# Patient Record
Sex: Male | Born: 1985
Health system: Southern US, Community
[De-identification: ages and names within clinical notes are randomized; demographics above are authoritative.]

## PROBLEM LIST (undated history)

## (undated) HISTORY — PX: ANKLE SURGERY: SHX546

---
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
## 2012-06-17 ENCOUNTER — Emergency Department (INDEPENDENT_AMBULATORY_CARE_PROVIDER_SITE_OTHER): Payer: 59

## 2012-06-17 ENCOUNTER — Emergency Department (HOSPITAL_COMMUNITY)
Admission: EM | Admit: 2012-06-17 | Discharge: 2012-06-17 | Disposition: A | Discharge status: Home / Self Care | Payer: 59 | Source: Home / Self Care | Attending: Family Medicine | Admitting: Family Medicine

## 2012-06-17 ENCOUNTER — Encounter (HOSPITAL_COMMUNITY): Payer: Self-pay | Admitting: *Deleted

## 2012-06-17 DIAGNOSIS — M546 Pain in thoracic spine: Secondary | ICD-10-CM

## 2012-06-17 DIAGNOSIS — Z041 Encounter for examination and observation following transport accident: Secondary | ICD-10-CM

## 2012-06-17 MED ORDER — CYCLOBENZAPRINE HCL 5 MG PO TABS: 5.0000 mg | ORAL_TABLET | Freq: Three times a day (TID) | ORAL | Status: AC | PRN

## 2012-06-17 MED ORDER — DICLOFENAC POTASSIUM 50 MG PO TABS: 50.0000 mg | ORAL_TABLET | Freq: Three times a day (TID) | ORAL | Status: DC
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
## 2013-05-22 ENCOUNTER — Encounter (HOSPITAL_COMMUNITY): Payer: Self-pay | Admitting: Emergency Medicine

## 2013-05-22 ENCOUNTER — Emergency Department (HOSPITAL_COMMUNITY): Payer: Self-pay

## 2013-05-22 ENCOUNTER — Emergency Department (HOSPITAL_COMMUNITY)
Admission: EM | Admit: 2013-05-22 | Discharge: 2013-05-22 | Disposition: A | Discharge status: Home / Self Care | Payer: Self-pay | Attending: Emergency Medicine | Admitting: Emergency Medicine

## 2013-05-22 DIAGNOSIS — F17200 Nicotine dependence, unspecified, uncomplicated: Secondary | ICD-10-CM | POA: Insufficient documentation

## 2013-05-22 DIAGNOSIS — S6000XA Contusion of unspecified finger without damage to nail, initial encounter: Secondary | ICD-10-CM | POA: Insufficient documentation

## 2013-05-22 DIAGNOSIS — IMO0002 Reserved for concepts with insufficient information to code with codable children: Secondary | ICD-10-CM | POA: Insufficient documentation

## 2013-05-22 DIAGNOSIS — Y9389 Activity, other specified: Secondary | ICD-10-CM | POA: Insufficient documentation

## 2013-05-22 DIAGNOSIS — Y9289 Other specified places as the place of occurrence of the external cause: Secondary | ICD-10-CM | POA: Insufficient documentation
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
## 2013-05-22 NOTE — ED Provider Notes (Signed)
CSN: [id number]     Arrival date & time 05/22/13  1639 History    This chart was scribed for a non-physician practitioner, Antony Madura, PA-C, working with Enid Skeens, MD by Frederik Pear, ED Scribe. This patient was seen in room TR05C/TR05C and the patient's care was started at 1653.     First MD Initiated Contact with Patient 05/22/13 1653     Chief Complaint  Patient presents with  . Finger Injury   (Consider location/radiation/quality/duration/timing/severity/associated sxs/prior Treatment) Patient is a 27 y.o. male presenting with hand injury. The history is provided by the patient and medical records. No language interpreter was used.  Hand Injury Location:  Finger Time since incident:  1 week Injury: yes   Mechanism of injury comment:  Smashed in a car door Finger location:  R index finger Pain details:    Radiates to:  Does not radiate   Severity:  Severe   Onset quality:  Sudden   Duration:  1 week   Timing:  Constant Chronicity:  New HPI Comments: Stephen Wells is a 27 y.o. male who presents to the Emergency Department complaining of a right index finger smash injury that occurred 1 week ago when he shut his finger in a car door. In the ED, he complains of severe, throbbing pain to the fingernail with pressure that is aggravated with movement and alleviated holding it still. He denies pain to his finger or hand. He states he came to the ED today because he has not seen improvement from the injury over the past week. He has treated the pain with Aleve with some relief. No allergies to medications.   History reviewed. No pertinent past medical history. History reviewed. No pertinent past surgical history. History reviewed. No pertinent family history. History  Substance Use Topics  . Smoking status: Current Some Day Smoker  . Smokeless tobacco: Not on file  . Alcohol Use: Yes    Review of Systems  Skin: Positive for wound (right index finger).  All other  systems reviewed and are negative.   Allergies  Review of patient's allergies indicates no known allergies.  Home Medications   Current Outpatient Rx  Name  Route  Sig  Dispense  Refill  . naproxen sodium (ANAPROX) 220 MG tablet   Oral   Take 220 mg by mouth 2 (two) times daily with a meal.          BP 122/75  Pulse 67  Temp(Src) 99 F (37.2 C) (Oral)  SpO2 99%  Physical Exam  Nursing note and vitals reviewed. Constitutional: He is oriented to person, place, and time. He appears well-developed and well-nourished. No distress.  HENT:  Head: Normocephalic and atraumatic.  Eyes: Conjunctivae and EOM are normal. No scleral icterus.  Neck: Normal range of motion.  Cardiovascular: Normal rate, regular rhythm and intact distal pulses.   Capillary refill is normal.  Pulmonary/Chest: Effort normal. No respiratory distress.  Abdominal: Soft. He exhibits no distension.  Musculoskeletal: Normal range of motion. He exhibits no edema and no tenderness.  Nail is intact, but appears to be out of the nail bed. Bruising around the nail bed. No heat-to-touch or erythema.  Neurological: He is alert and oriented to person, place, and time.  4/5 strength of extensor of the right index finger distal to his DIP joint. All other extensors of the right index finger are 5/5. FDP and FDS are 5/5. Sensation to light touch intact.  Skin: Skin is warm and dry.  No rash noted. No erythema. No pallor.  Psychiatric: He has a normal mood and affect. His behavior is normal.    ED Course   Procedures (including critical care time)  DIAGNOSTIC STUDIES: Oxygen Saturation is 99% on room air, normal by my interpretation.    COORDINATION OF CARE:  17:15- Discussed planned course of treatment in the ED with the patient, including a right index finger X-ray, who is agreeable at this time.  19:28- 1-view right index finger X-ray independently read by radiologist and independently reviewed by Antony Madura,  PA-C. Discussed the discharge plan with the patient, including following up with a hand specialist and applying an ace wrap for compression, who is agreeable at this time.   Labs Reviewed - No data to display Dg Finger Index Right  05/22/2013   *RADIOLOGY REPORT*  Clinical Data: Swelling, bruising, injury  RIGHT INDEX FINGER 2+V  Comparison: None  Findings: Three views of the left second finger submitted.  No acute fracture or subluxation.  Dorsal soft tissue swelling is noted distal finger.  IMPRESSION: No acute fracture or subluxation.  Dorsal soft tissue swelling distal finger.   Original Report Authenticated By: Natasha Mead, M.D.   1. Contusion of finger of right hand, initial encounter    MDM  Uncomplicated contusion to right index finger - physical exam findings as above. Patient neurovascularly intact. There is no erythema or heat-to-touch to suspect underlying infectious or cellulitic process. No pallor, pulselessness, poikilothermia, or paresthesias to suspect complicating injury. Extensor strength against resistance of distal R index finger is decreased; however this may be secondary to pain rather than tendon injury. Have recommended patient follow up with hand specialist for further evaluation; referral given. Patient appropriate for discharge with necessary followup. Ibuprofen recommended for pain control. Indications for ED return discussed and patient agreeable to plan.  I personally performed the services described in this documentation, which was scribed in my presence. The recorded information has been reviewed and is accurate.     Antony Madura, PA-C 05/22/13 1740  Antony Madura, PA-C 05/22/13 [phone]

## 2013-05-23 NOTE — ED Provider Notes (Signed)
Medical screening examination/treatment/procedure(s) were performed by non-physician practitioner and as supervising physician I was immediately available for consultation/collaboration.   Catera Hankins M Robby Pirani, MD 05/23/13 2103 

## 2016-04-12 ENCOUNTER — Other Ambulatory Visit: Payer: Self-pay | Admitting: Nurse Practitioner

## 2016-04-12 ENCOUNTER — Ambulatory Visit
Admission: RE | Admit: 2016-04-12 | Discharge: 2016-04-12 | Disposition: A | Discharge status: Home / Self Care | Payer: Worker's Compensation | Source: Ambulatory Visit | Attending: Nurse Practitioner | Admitting: Nurse Practitioner

## 2016-04-12 DIAGNOSIS — R609 Edema, unspecified: Secondary | ICD-10-CM

## 2016-04-12 DIAGNOSIS — R52 Pain, unspecified: Secondary | ICD-10-CM

## 2016-06-27 ENCOUNTER — Encounter (HOSPITAL_COMMUNITY): Payer: Self-pay | Admitting: Emergency Medicine

## 2016-06-27 ENCOUNTER — Ambulatory Visit (INDEPENDENT_AMBULATORY_CARE_PROVIDER_SITE_OTHER): Payer: Commercial Managed Care - HMO

## 2016-06-27 ENCOUNTER — Ambulatory Visit (HOSPITAL_COMMUNITY)
Admission: EM | Admit: 2016-06-27 | Discharge: 2016-06-27 | Disposition: A | Discharge status: Home / Self Care | Payer: Commercial Managed Care - HMO | Attending: Family Medicine | Admitting: Family Medicine

## 2016-06-27 DIAGNOSIS — S62309A Unspecified fracture of unspecified metacarpal bone, initial encounter for closed fracture: Secondary | ICD-10-CM

## 2016-06-27 NOTE — Progress Notes (Signed)
Orthopedic Tech Progress Note Patient Details:  Stephen Wells 1986/01/12 [id number]  Ortho Devices Type of Ortho Device: Ace wrap, Ulna gutter splint Ortho Device/Splint Location: RUE Ortho Device/Splint Interventions: Ordered, Application   Jennye MoccasinHughes, Stephen Wells 06/27/2016, 3:11 PM

## 2016-06-27 NOTE — ED Notes (Signed)
Contacted ortho tech, aware of patient's casting needs

## 2016-06-27 NOTE — Discharge Instructions (Signed)
You need to make a follow-up appointment with Dr. Dominica SeverinWilliam Gramig for the fracture. Please call his office and schedule an appointment for next week.  Study Result   CLINICAL DATA:  Patient with pain and swelling throughout the right hand after punching a wall 1 week prior. Initial encounter.   EXAM: RIGHT HAND - COMPLETE 3+ VIEW   COMPARISON:  None.   FINDINGS: There is a nondisplaced oblique lucency through the proximal aspect of the fifth metacarpal. Overlying soft tissue swelling. No evidence for associated acute osseous abnormalities. No significant degenerative changes.   IMPRESSION: Suggestion of nondisplaced oblique lucency through the proximal diaphysis of the fifth metacarpal with overlying soft tissue swelling, concerning for nondisplaced fracture. Vascular channel is additional consideration. Recommend correlation for point tenderness.
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
## 2016-06-27 NOTE — ED Triage Notes (Signed)
Right wrist pain for 7 days

## 2016-10-31 DIAGNOSIS — Z209 Contact with and (suspected) exposure to unspecified communicable disease: Secondary | ICD-10-CM | POA: Diagnosis not present

## 2017-04-19 DIAGNOSIS — R300 Dysuria: Secondary | ICD-10-CM | POA: Diagnosis not present

## 2017-07-11 IMAGING — CR DG FOOT COMPLETE 3+V*R*
3 series · 3 of 3 positions shown · non-contrast
Comparison: None.

CLINICAL DATA: Injury in Achilles tendon region 2 hours ago.
Difficulty walking.

EXAM:
RIGHT FOOT COMPLETE - 3+ VIEW

[x foot lat right]
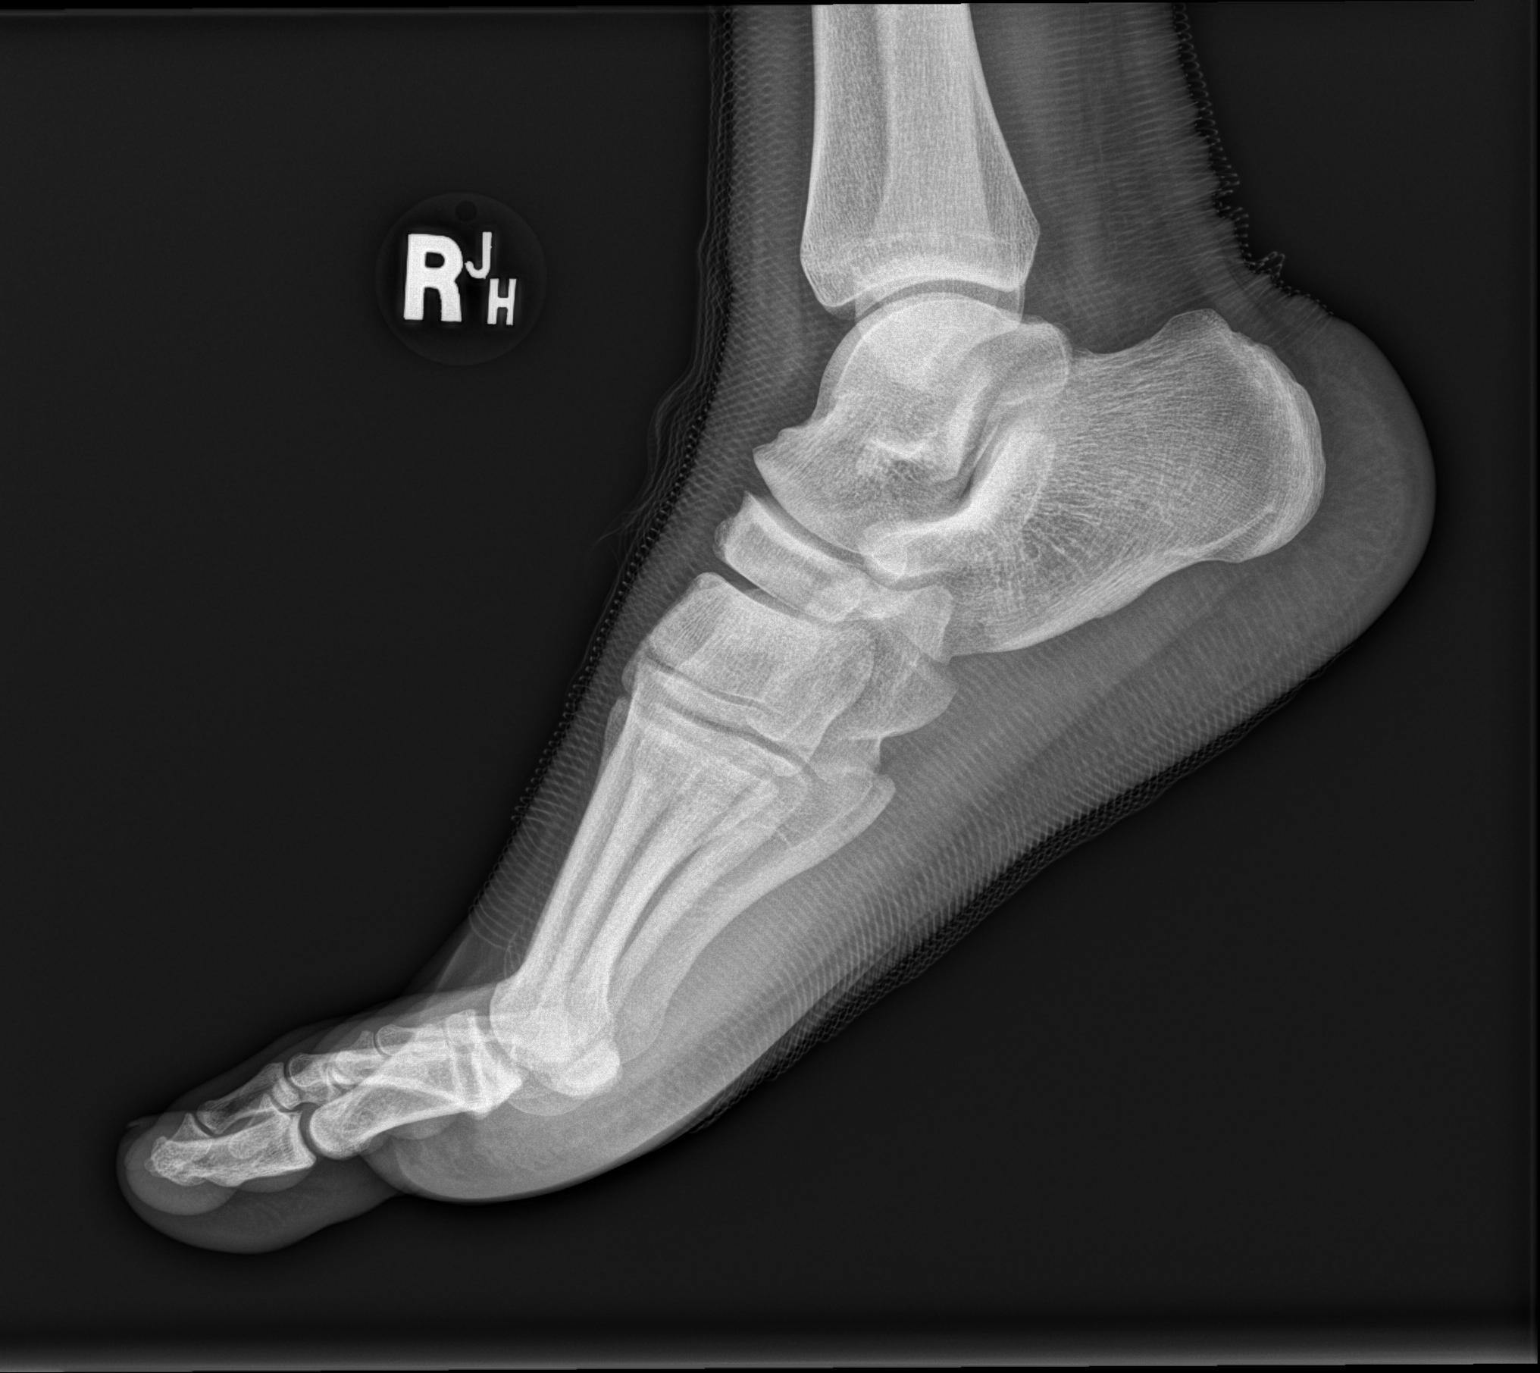

[x foot ap right]
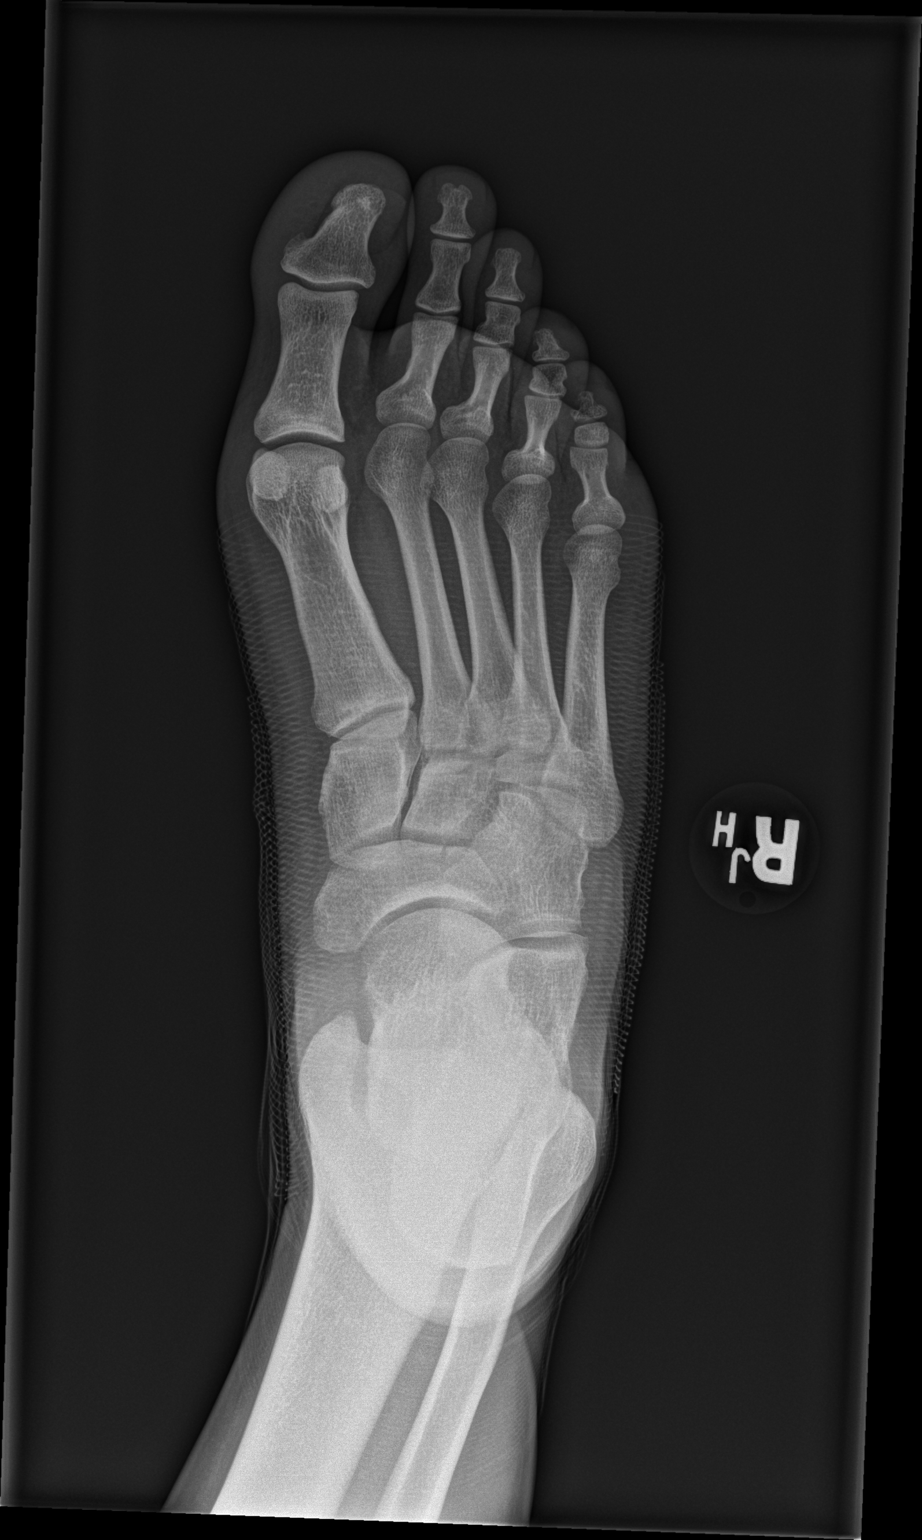

[x foot obl right]
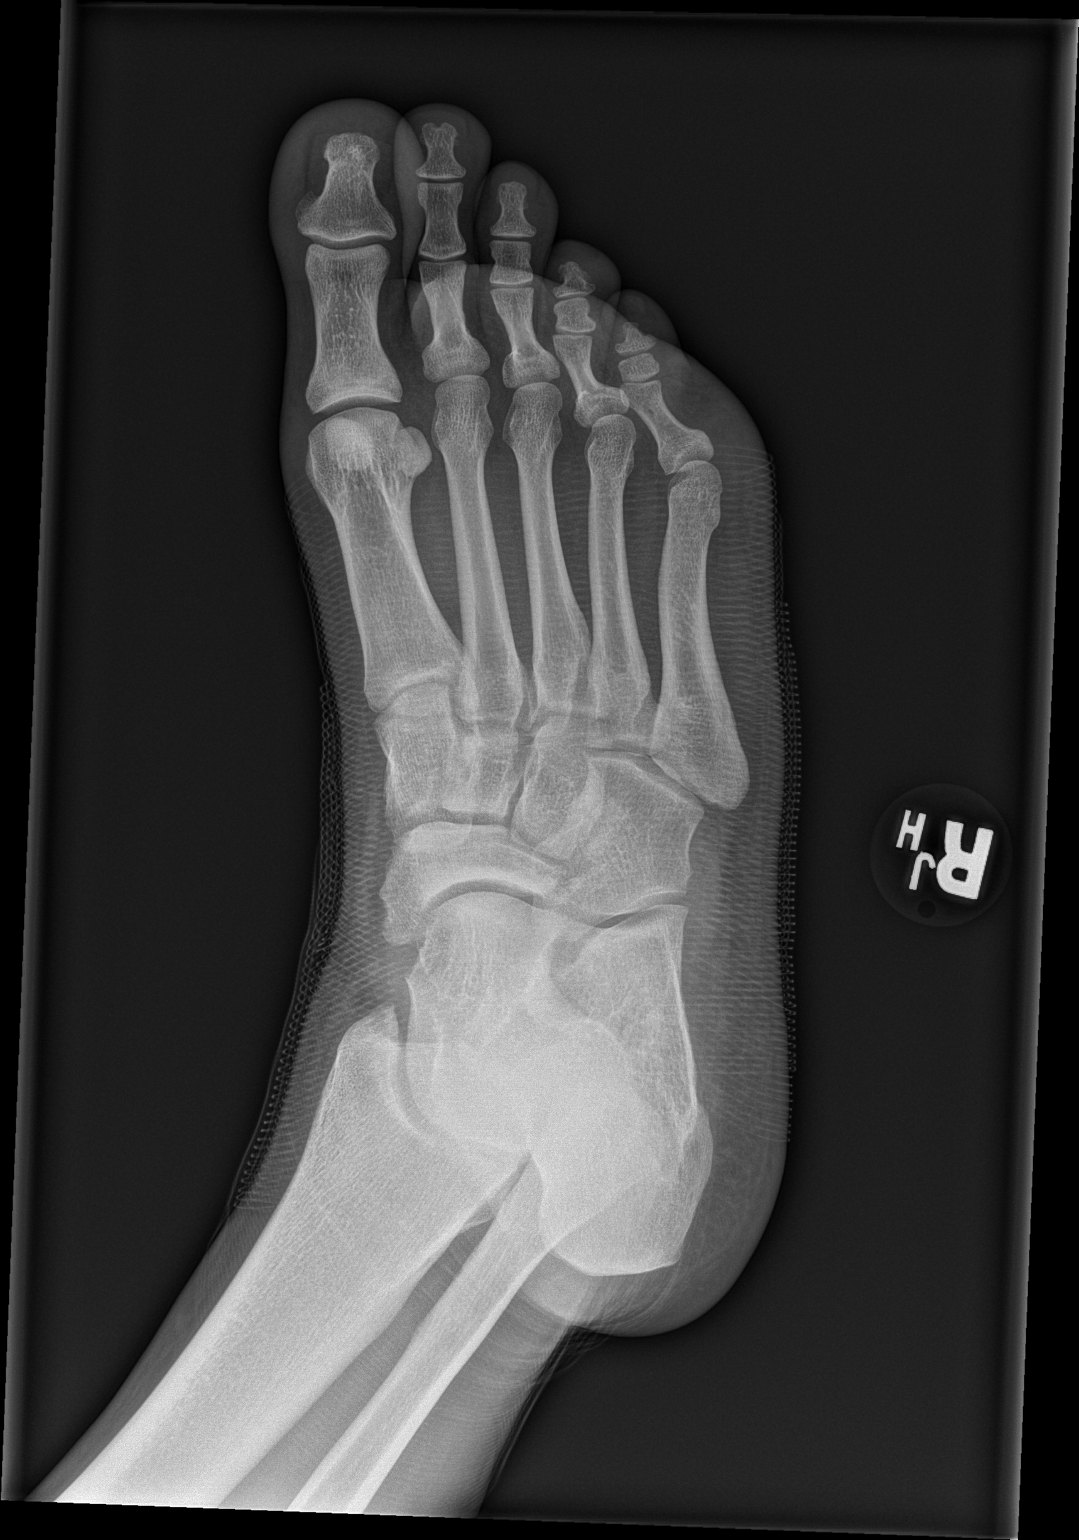

[3 of 3 positions shown; findings below may reference images not displayed]

FINDINGS: There is no evidence of fracture or dislocation. There is no
evidence of arthropathy or other focal bone abnormality. Soft
tissues are unremarkable.
IMPRESSION: Negative.

## 2017-09-05 ENCOUNTER — Encounter (HOSPITAL_COMMUNITY): Payer: Self-pay | Admitting: Emergency Medicine

## 2017-09-05 ENCOUNTER — Ambulatory Visit (HOSPITAL_COMMUNITY)
Admission: EM | Admit: 2017-09-05 | Discharge: 2017-09-05 | Disposition: A | Discharge status: Home / Self Care | Payer: 59 | Attending: Family Medicine | Admitting: Family Medicine

## 2017-09-05 ENCOUNTER — Other Ambulatory Visit: Payer: Self-pay

## 2017-09-05 DIAGNOSIS — Z202 Contact with and (suspected) exposure to infections with a predominantly sexual mode of transmission: Secondary | ICD-10-CM

## 2017-09-05 DIAGNOSIS — Z113 Encounter for screening for infections with a predominantly sexual mode of transmission: Secondary | ICD-10-CM

## 2017-09-05 LAB — POCT URINALYSIS DIP (DEVICE)
BILIRUBIN URINE: NEGATIVE
GLUCOSE, UA: NEGATIVE mg/dL
Hgb urine dipstick: NEGATIVE
KETONES UR: NEGATIVE mg/dL
Leukocytes, UA: NEGATIVE
Nitrite: NEGATIVE
PROTEIN: NEGATIVE mg/dL
Specific Gravity, Urine: >=1.03 (ref 1.005–1.030)
Urobilinogen, UA: 0.2 mg/dL (ref 0.0–1.0)
pH: 6 (ref 5.0–8.0)

## 2017-09-05 MED ORDER — CEFTRIAXONE SODIUM 250 MG IJ SOLR
INTRAMUSCULAR | Status: AC
  Filled 2017-09-05: qty 250

## 2017-09-05 MED ORDER — METRONIDAZOLE 500 MG PO TABS
2000.0000 mg | ORAL_TABLET | Freq: Once | ORAL | Status: AC
  Administered 2017-09-05: 2000 mg via ORAL

## 2017-09-05 MED ORDER — METRONIDAZOLE 500 MG PO TABS
ORAL_TABLET | ORAL | Status: AC
  Filled 2017-09-05: qty 4

## 2017-09-05 MED ORDER — AZITHROMYCIN 250 MG PO TABS
1000.0000 mg | ORAL_TABLET | Freq: Once | ORAL | Status: AC
  Administered 2017-09-05: 1000 mg via ORAL

## 2017-09-05 MED ORDER — AZITHROMYCIN 250 MG PO TABS
ORAL_TABLET | ORAL | Status: AC
  Filled 2017-09-05: qty 4

## 2017-09-05 MED ORDER — CEFTRIAXONE SODIUM 250 MG IJ SOLR
250.0000 mg | Freq: Once | INTRAMUSCULAR | Status: AC
  Administered 2017-09-05: 250 mg via INTRAMUSCULAR

## 2017-09-05 NOTE — Discharge Instructions (Signed)
Practice safe sex

## 2017-09-05 NOTE — ED Triage Notes (Signed)
Pt states "I got a call from a lady that I should get checked". Pt partner stated she had some vaginal discharge. Pt denies symptoms.

## 2017-09-05 NOTE — ED Provider Notes (Signed)
  [facility]   [id number] 09/05/17 Arrival Time: 1554   SUBJECTIVE:  Marina Gravellfonzo Derderian III is a 31 y.o. male who presents to the urgent care with complaint of exposure to STD.     History reviewed. No pertinent past medical history. No family history on file. Social History   Socioeconomic History  . Marital status: Single    Spouse name: Not on file  . Number of children: Not on file  . Years of education: Not on file  . Highest education level: Not on file  Social Needs  . Financial resource strain: Not on file  . Food insecurity - worry: Not on file  . Food insecurity - inability: Not on file  . Transportation needs - medical: Not on file  . Transportation needs - non-medical: Not on file  Occupational History  . Not on file  Tobacco Use  . Smoking status: Current Some Day Smoker  Substance and Sexual Activity  . Alcohol use: Yes  . Drug use: No  . Sexual activity: Not on file  Other Topics Concern  . Not on file  Social History Narrative  . Not on file   No outpatient medications have been marked as taking for the 09/05/17 encounter [facility](Hospital Encounter).   No Known Allergies    ROS: As per HPI, remainder of ROS negative.   OBJECTIVE:   Vitals:   09/05/17 1623  BP: (!) 146/63  Pulse: 73  Resp: 18  Temp: 98 F (36.7 C)  SpO2: 100%     General appearance: alert; no distress Eyes: PERRL; EOMI; conjunctiva normal HENT: normocephalic; atraumatic;  external ears normal without trauma; nasal mucosa normal; oral mucosa normal Neck: supple Back: no CVA tenderness Extremities: no cyanosis or edema; symmetrical with no gross deformities Skin: warm and dry Neurologic: normal gait; grossly normal Psychological: alert and cooperative; normal mood and affect      Labs:  Results for orders placed or performed during the hospital encounter of 09/05/17  POCT urinalysis dip (device)  Result Value Ref Range   Glucose, UA NEGATIVE NEGATIVE mg/dL     Bilirubin Urine NEGATIVE NEGATIVE   Ketones, ur NEGATIVE NEGATIVE mg/dL   Specific Gravity, Urine >=1.030 1.005 - 1.030   Hgb urine dipstick NEGATIVE NEGATIVE   pH 6.0 5.0 - 8.0   Protein, ur NEGATIVE NEGATIVE mg/dL   Urobilinogen, UA 0.2 0.0 - 1.0 mg/dL   Nitrite NEGATIVE NEGATIVE   Leukocytes, UA NEGATIVE NEGATIVE    Labs Reviewed  POCT URINALYSIS DIP (DEVICE)  URINE CYTOLOGY ANCILLARY ONLY    No results found.     ASSESSMENT & PLAN:  1. STD exposure     Meds ordered this encounter  Medications  . azithromycin (ZITHROMAX) tablet 1,000 mg  . cefTRIAXone (ROCEPHIN) injection 250 mg  . metroNIDAZOLE (FLAGYL) tablet 2,000 mg    Reviewed expectations re: course of current medical issues. Questions answered. Outlined signs and symptoms indicating need for more acute intervention. Patient verbalized understanding. After Visit Summary given.    Procedures:      Elvina SidleLauenstein, Cowan Pilar, MD 09/05/17 [phone]

## 2017-09-06 LAB — URINE CYTOLOGY ANCILLARY ONLY
Chlamydia: NEGATIVE
Neisseria Gonorrhea: NEGATIVE
Trichomonas: NEGATIVE

## 2017-09-25 IMAGING — DX DG HAND COMPLETE 3+V*R*
3 series · 3 of 3 positions shown · non-contrast
Comparison: None.

CLINICAL DATA: Patient with pain and swelling throughout the right
hand after punching a wall 1 week prior. Initial encounter.

EXAM:
RIGHT HAND - COMPLETE 3+ VIEW

[hand pa]
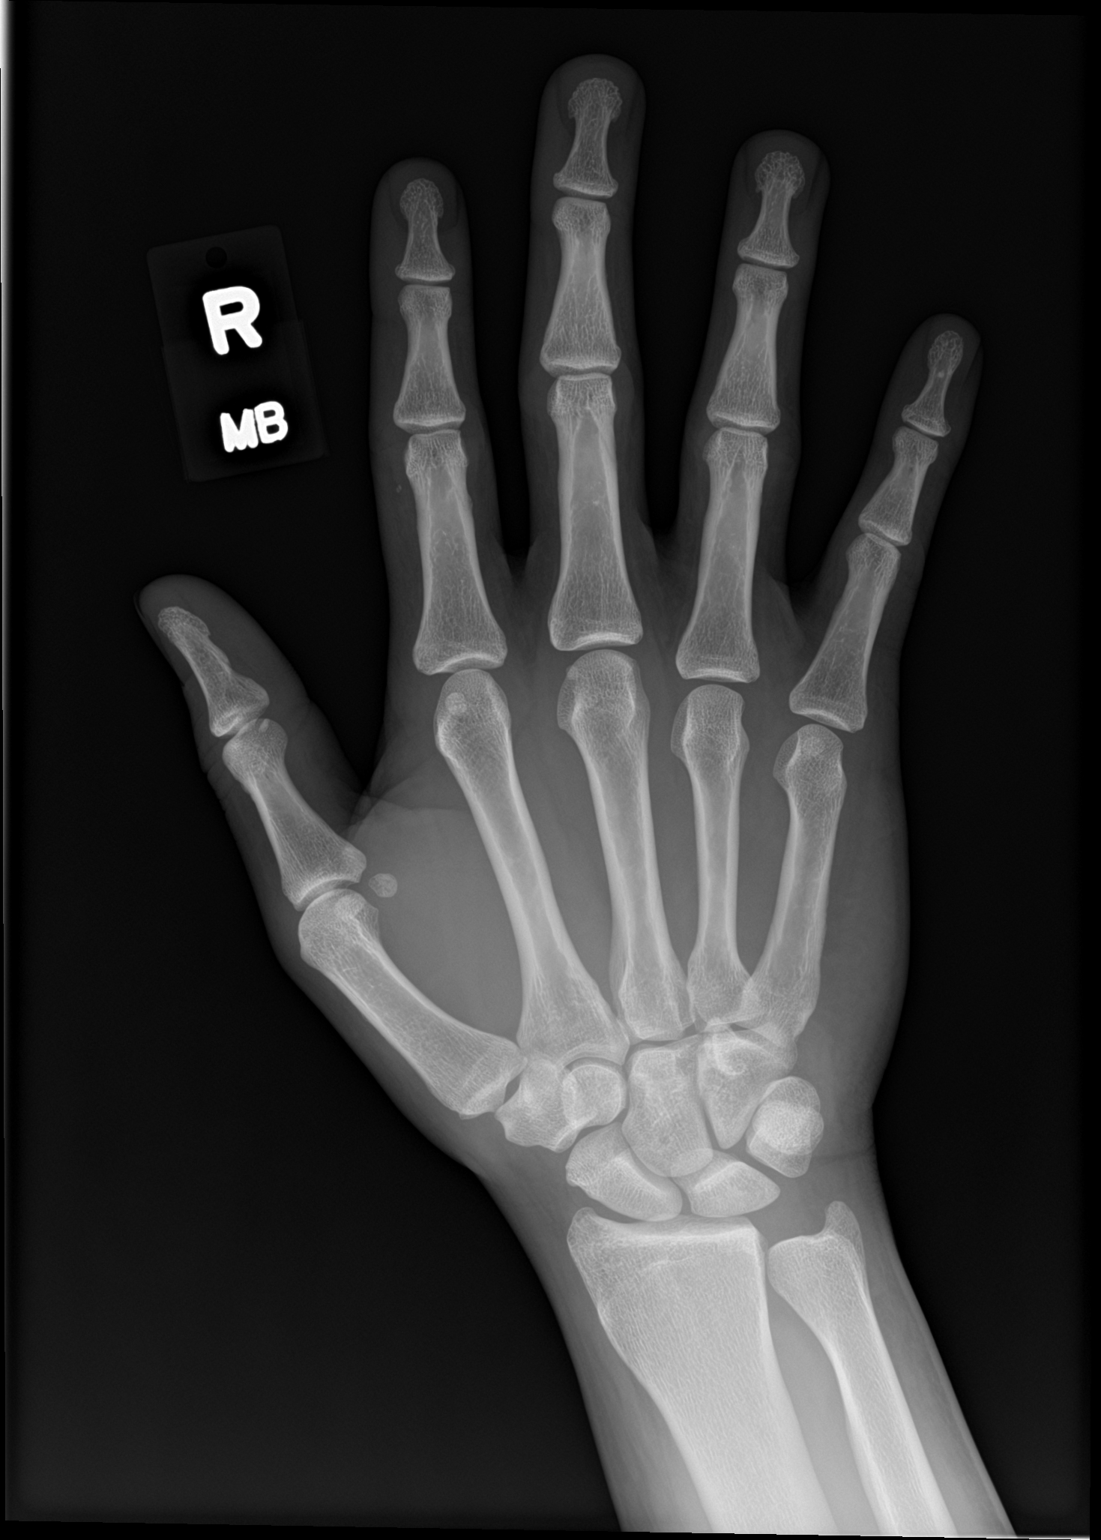

[hand obl]
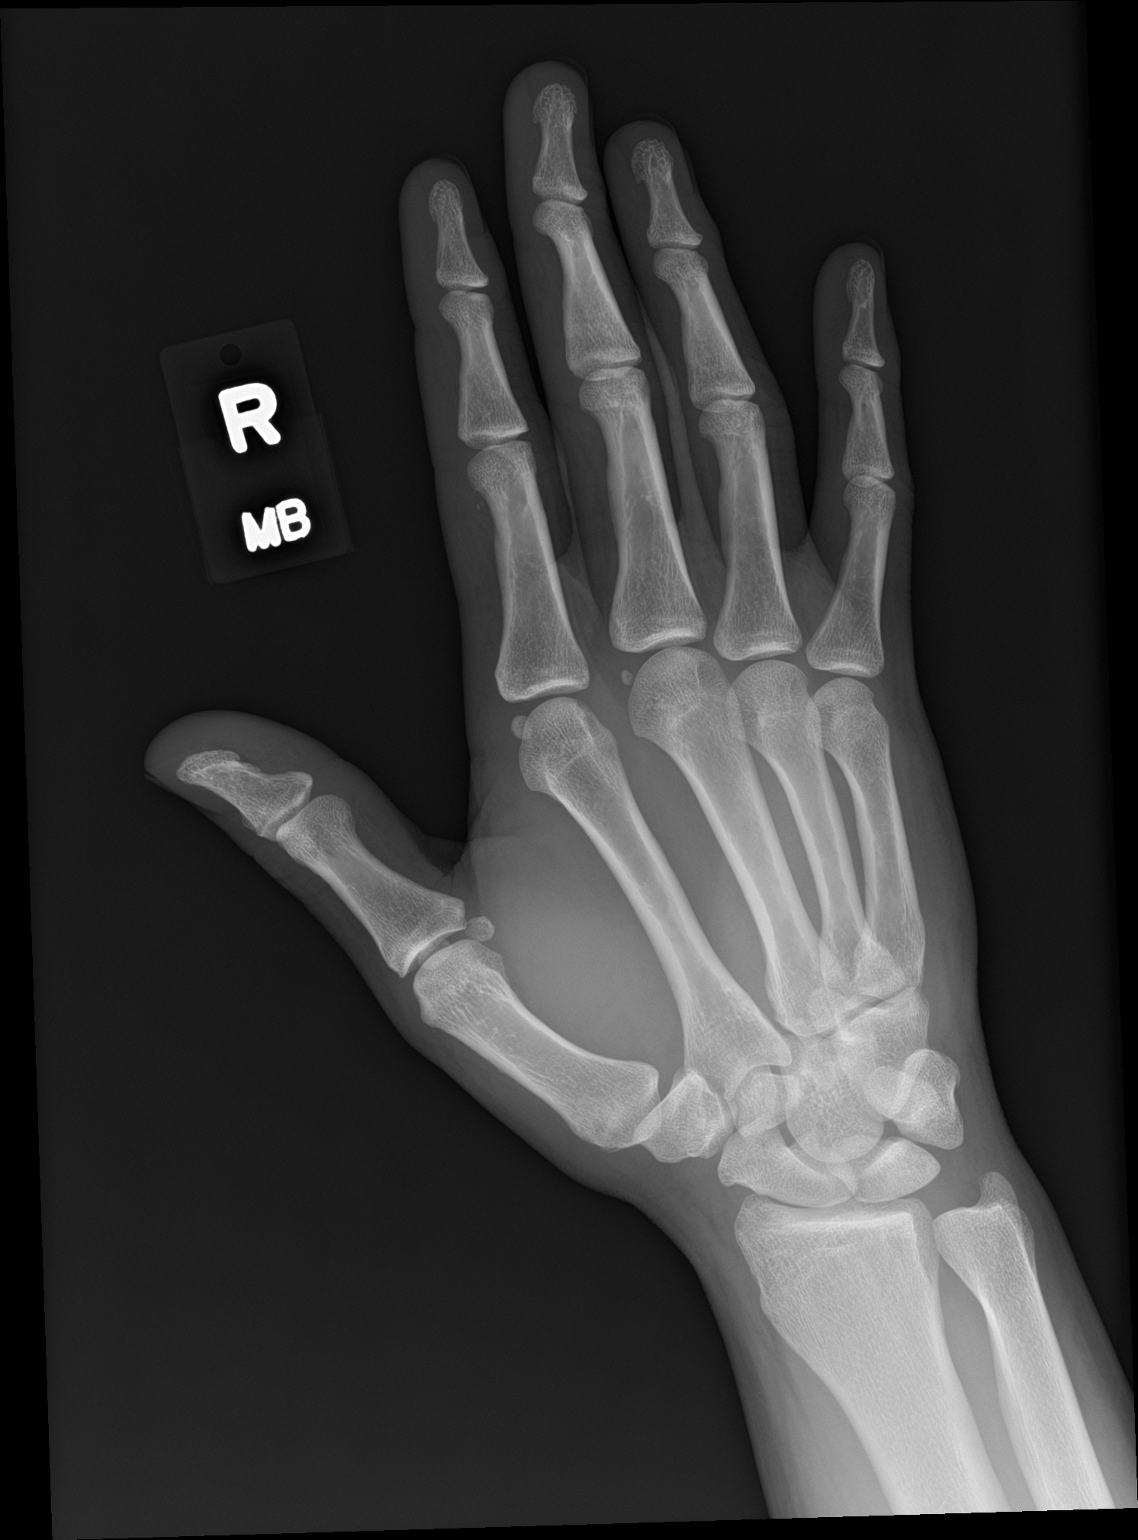

[hand lat]
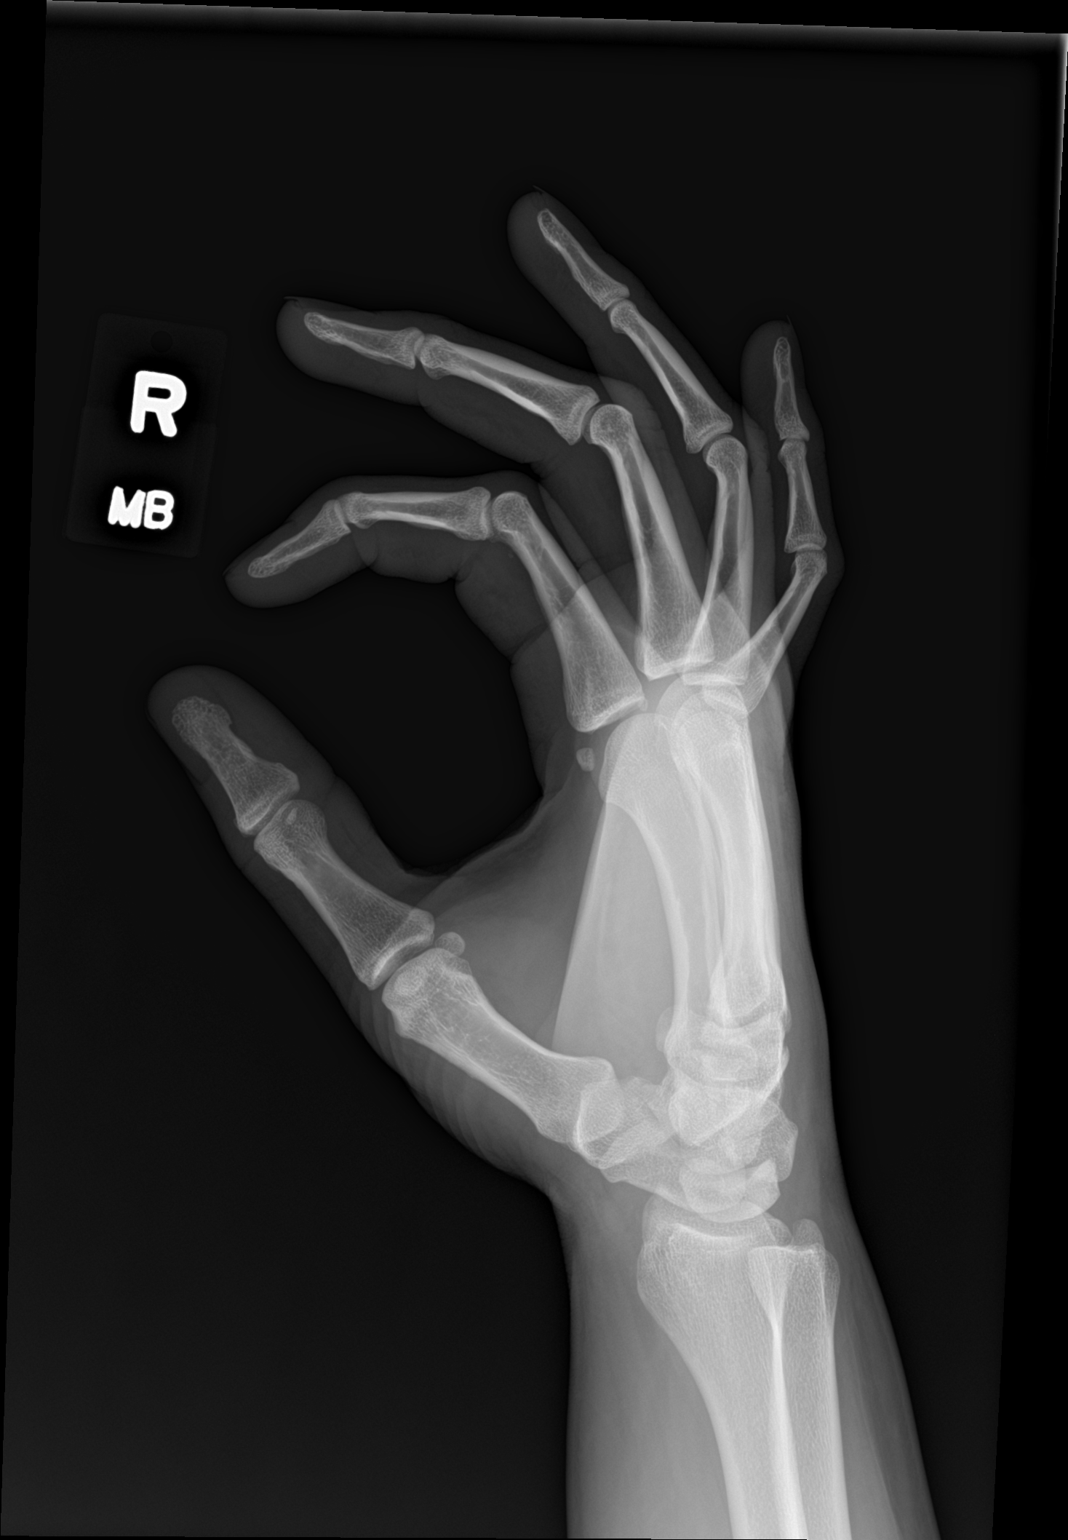

[3 of 3 positions shown; findings below may reference images not displayed]

FINDINGS: There is a nondisplaced oblique lucency through the proximal aspect
of the fifth metacarpal. Overlying soft tissue swelling. No evidence
for associated acute osseous abnormalities. No significant
degenerative changes.
IMPRESSION: Suggestion of nondisplaced oblique lucency through the proximal
diaphysis of the fifth metacarpal with overlying soft tissue
swelling, concerning for nondisplaced fracture. Vascular channel is
additional consideration. Recommend correlation for point
tenderness.

## 2021-09-14 ENCOUNTER — Emergency Department (HOSPITAL_COMMUNITY): Payer: Self-pay

## 2021-09-14 ENCOUNTER — Encounter (HOSPITAL_COMMUNITY): Payer: Self-pay

## 2021-09-14 ENCOUNTER — Emergency Department (HOSPITAL_COMMUNITY)
Admission: EM | Admit: 2021-09-14 | Discharge: 2021-09-14 | Disposition: A | Discharge status: Home / Self Care | Payer: Self-pay | Attending: Emergency Medicine | Admitting: Emergency Medicine

## 2021-09-14 ENCOUNTER — Other Ambulatory Visit: Payer: Self-pay

## 2021-09-14 DIAGNOSIS — R109 Unspecified abdominal pain: Secondary | ICD-10-CM | POA: Insufficient documentation

## 2021-09-14 DIAGNOSIS — M545 Low back pain, unspecified: Secondary | ICD-10-CM | POA: Insufficient documentation

## 2021-09-14 DIAGNOSIS — R000 Tachycardia, unspecified: Secondary | ICD-10-CM | POA: Insufficient documentation

## 2021-09-14 DIAGNOSIS — F17200 Nicotine dependence, unspecified, uncomplicated: Secondary | ICD-10-CM | POA: Insufficient documentation

## 2021-09-14 DIAGNOSIS — R112 Nausea with vomiting, unspecified: Secondary | ICD-10-CM | POA: Insufficient documentation

## 2021-09-14 LAB — HEPATIC FUNCTION PANEL
ALT: 25 U/L (ref 0–44)
AST: 20 U/L (ref 15–41)
Albumin: 4.5 g/dL (ref 3.5–5.0)
Alkaline Phosphatase: 42 U/L (ref 38–126)
Bilirubin, Direct: <0.1 mg/dL (ref 0.0–0.2)
Total Bilirubin: 0.5 mg/dL (ref 0.3–1.2)
Total Protein: 7.4 g/dL (ref 6.5–8.1)

## 2021-09-14 LAB — LIPASE, BLOOD: Lipase: 28 U/L (ref 11–51)

## 2021-09-14 LAB — URINALYSIS, ROUTINE W REFLEX MICROSCOPIC
Bilirubin Urine: NEGATIVE
Glucose, UA: NEGATIVE mg/dL
Hgb urine dipstick: NEGATIVE
Ketones, ur: 5 mg/dL — AB
Leukocytes,Ua: NEGATIVE
Nitrite: NEGATIVE
Protein, ur: NEGATIVE mg/dL
Specific Gravity, Urine: 1.017 (ref 1.005–1.030)
pH: 6 (ref 5.0–8.0)

## 2021-09-14 LAB — BASIC METABOLIC PANEL
Anion gap: 10 (ref 5–15)
BUN: 7 mg/dL (ref 6–20)
CO2: 23 mmol/L (ref 22–32)
Calcium: 9.3 mg/dL (ref 8.9–10.3)
Chloride: 104 mmol/L (ref 98–111)
Creatinine, Ser: 1.05 mg/dL (ref 0.61–1.24)
GFR, Estimated: >60 mL/min (ref >60)
Glucose, Bld: 108 mg/dL — ABNORMAL HIGH (ref 70–99)
Potassium: 3.9 mmol/L (ref 3.5–5.1)
Sodium: 137 mmol/L (ref 135–145)

## 2021-09-14 LAB — CBC
HCT: 41.5 % (ref 39.0–52.0)
Hemoglobin: 14.2 g/dL (ref 13.0–17.0)
MCH: 30.7 pg (ref 26.0–34.0)
MCHC: 34.2 g/dL (ref 30.0–36.0)
MCV: 89.8 fL (ref 80.0–100.0)
Platelets: 257 10*3/uL (ref 150–400)
RBC: 4.62 MIL/uL (ref 4.22–5.81)
RDW: 13.4 % (ref 11.5–15.5)
WBC: 7.7 10*3/uL (ref 4.0–10.5)
nRBC: 0 % (ref 0.0–0.2)

## 2021-09-14 MED ORDER — ONDANSETRON 4 MG PO TBDP: 4.0000 mg | ORAL_TABLET | Freq: Three times a day (TID) | ORAL | 0 refills | Status: DC | PRN

## 2021-09-14 MED ORDER — METHOCARBAMOL 500 MG PO TABS: 500.0000 mg | ORAL_TABLET | Freq: Three times a day (TID) | ORAL | 0 refills | Status: DC | PRN

## 2021-09-14 MED ORDER — SODIUM CHLORIDE (PF) 0.9 % IJ SOLN
INTRAMUSCULAR | Status: AC
  Filled 2021-09-14: qty 50

## 2021-09-14 MED ORDER — NAPROXEN 500 MG PO TABS: 500.0000 mg | ORAL_TABLET | Freq: Two times a day (BID) | ORAL | 0 refills | Status: DC | PRN

## 2021-09-14 MED ORDER — SODIUM CHLORIDE 0.9 % IV BOLUS
1000.0000 mL | Freq: Once | INTRAVENOUS | Status: AC
  Administered 2021-09-14: 1000 mL via INTRAVENOUS

## 2021-09-14 MED ORDER — HYDROMORPHONE HCL 1 MG/ML IJ SOLN
1.0000 mg | Freq: Once | INTRAMUSCULAR | Status: AC
  Administered 2021-09-14: 1 mg via INTRAVENOUS
  Filled 2021-09-14: qty 1

## 2021-09-14 MED ORDER — IOHEXOL 350 MG/ML SOLN
100.0000 mL | Freq: Once | INTRAVENOUS | Status: AC | PRN
  Administered 2021-09-14: 100 mL via INTRAVENOUS

## 2021-09-14 NOTE — ED Provider Notes (Signed)
[facility]-EMERGENCY DEPT Provider Note   CSN: [id number] Arrival date & time: 09/14/21  0200     History Chief Complaint  Patient presents with   Flank Pain    Stephen Wells is a 35 y.o. male w/ a hx of tobacco use who presents to the ED with complaints of back pain that began a few hours PTA. Patient reports bilateral flank/lower back pain radiating into the groin, constant, severe, no  aggravating factors, Per EMS gave 200 mcg of fentanyl & 4 mg of zofran with some mild relief but then pain returned severely again. Having associated nausea & vomiting. Denies fever, hematemesis, diarrhea, dysuria, hematuria,  numbness, tingling, weakness, saddle anesthesia, incontinence to bowel/bladder, fever, chills, IV drug use, dysuria, or hx of cancer.  HPI     History reviewed. No pertinent past medical history.  There are no problems to display for this patient.   History reviewed. No pertinent surgical history.     History reviewed. No pertinent family history.  Social History   Tobacco Use   Smoking status: Some Days  Substance Use Topics   Alcohol use: Yes   Drug use: No    Home Medications Prior to Admission medications   Not on File    Allergies    Patient has no known allergies.  Review of Systems   Review of Systems  Constitutional:  Negative for chills, fever and unexpected weight change.  Respiratory:  Negative for shortness of breath.   Cardiovascular:  Negative for chest pain.  Gastrointestinal:  Positive for abdominal pain, nausea and vomiting. Negative for blood in stool, constipation and diarrhea.  Genitourinary:  Positive for flank pain. Negative for dysuria.  Musculoskeletal:  Positive for back pain.  Neurological:  Negative for syncope, weakness and numbness.       Negative for saddle anesthesia or bowel/bladder incontinence.   All other systems reviewed and are negative.  Physical Exam Updated Vital Signs BP (!) 145/91  (BP Location: Left Arm)   Pulse (!) 103   Temp 98.2 F (36.8 C) (Oral)   Resp 18   Ht 5\' 9"  (1.753 m)   Wt 90.7 kg   SpO2 100%   BMI 29.53 kg/m   Physical Exam Vitals and nursing note reviewed.  Constitutional:      General: He is in acute distress.     Appearance: He is well-developed. He is diaphoretic.  HENT:     Head: Normocephalic and atraumatic.  Eyes:     General:        Right eye: No discharge.        Left eye: No discharge.     Conjunctiva/sclera: Conjunctivae normal.  Cardiovascular:     Rate and Rhythm: Regular rhythm. Tachycardia present.     Pulses:          Posterior tibial pulses are 2+ on the right side and 2+ on the left side.  Pulmonary:     Effort: Pulmonary effort is normal. No respiratory distress.     Breath sounds: Normal breath sounds. No wheezing, rhonchi or rales.  Abdominal:     General: There is no distension.     Palpations: Abdomen is soft.     Tenderness: There is no abdominal tenderness. There is no right CVA tenderness, left CVA tenderness, guarding or rebound.  Genitourinary:    Penis: No tenderness or swelling.      Testes:        Right: Tenderness or swelling not  present.        Left: Tenderness or swelling not present.     Epididymis:     Right: No tenderness.     Left: No tenderness.     Comments: NT present as chaperone.  Musculoskeletal:     Cervical back: Neck supple.     Comments: No midline spinal tenderness.  Moving extremities   Skin:    General: Skin is warm.     Findings: No rash.  Neurological:     Mental Status: He is alert.     Comments: Clear speech. Sensation grossly intact to bilateral lower extremities. 5/5 symmetric strength with plantar/dorsiflexion bilaterally.   Psychiatric:        Behavior: Behavior normal.    ED Results / Procedures / Treatments   Labs (all labs ordered are listed, but only abnormal results are displayed) Labs Reviewed  URINALYSIS, ROUTINE W REFLEX MICROSCOPIC - Abnormal; Notable  for the following components:      Result Value   Ketones, ur 5 (*)    All other components within normal limits  BASIC METABOLIC PANEL - Abnormal; Notable for the following components:   Glucose, Bld 108 (*)    All other components within normal limits  CBC  LIPASE, BLOOD  HEPATIC FUNCTION PANEL    EKG None  Radiology CT Angio Chest/Abd/Pel for Dissection W and/or W/WO  Result Date: 09/14/2021 CLINICAL DATA:  Chest or back pain with aortic dissection suspected. Left flank pain with nausea and vomiting. EXAM: CT ANGIOGRAPHY CHEST, ABDOMEN AND PELVIS TECHNIQUE: Non-contrast CT of the chest was initially obtained. Multidetector CT imaging through the chest, abdomen and pelvis was performed using the standard protocol during bolus administration of intravenous contrast. Multiplanar reconstructed images and MIPs were obtained and reviewed to evaluate the vascular anatomy. CONTRAST:  15mL OMNIPAQUE IOHEXOL 350 MG/ML SOLN COMPARISON:  None. FINDINGS: CTA CHEST FINDINGS Cardiovascular: Preferential opacification of the thoracic aorta. No evidence of thoracic aortic aneurysm or dissection. Normal heart size. No pericardial effusion. Mediastinum/Nodes: Negative for adenopathy or hematoma Lungs/Pleura: There is no edema, consolidation, effusion, or pneumothorax. Musculoskeletal: No acute finding Review of the MIP images confirms the above findings. CTA ABDOMEN AND PELVIS FINDINGS VASCULAR Aorta: Unremarkable Celiac: Unremarkable SMA: Unremarkable Renals: Unremarkable IMA: Patent Inflow: Unremarkable Veins: Unremarkable Review of the MIP images confirms the above findings. NON-VASCULAR Hepatobiliary: No focal liver abnormality. Low-density of the liver could be related to contrast timing. No evidence of biliary obstruction or stone. Pancreas: Unremarkable. Spleen: Unremarkable. Adrenals/Urinary Tract: Negative adrenals. No hydronephrosis or stone. Unremarkable bladder. Stomach/Bowel:  No obstruction. No  appendicitis. Lymphatic: No mass or adenopathy. Reproductive:No pathologic findings. Minimal prostate calcification, nonspecific. Other: No ascites or pneumoperitoneum. Musculoskeletal: No acute abnormalities. Review of the MIP images confirms the above findings. IMPRESSION: Negative CTA of the aorta.  No explanation for symptoms. Electronically Signed   By: Jorje Guild M.D.   On: 09/14/2021 06:35    Procedures Procedures   Medications Ordered in ED Medications  sodium chloride 0.9 % bolus 1,000 mL (has no administration in time range)  HYDROmorphone (DILAUDID) injection 1 mg (has no administration in time range)    ED Course  I have reviewed the triage vital signs and the nursing notes.  Pertinent labs & imaging results that were available during my care of the patient were reviewed by me and considered in my medical decision making (see chart for details).    MDM Rules/Calculators/A&P  Patient presents to the ED with complaints of bilateral flank pain radiating to the groin.  On my assessment patient is uncomfortable appearing, diaphoretic, hypertensive, and tachycardic.  He has no abdominal tenderness or peritoneal signs.  GU exam performed with chaperone present, no testicular tenderness or obvious torsion.  Symmetric pulses bilaterally without neurologic deficit.  Additional history obtained:  Additional history obtained from chart review & nursing note review.   Lab Tests:  I Ordered, reviewed, and interpreted labs, which included:  CBC: Unremarkable BMP: Unremarkable Hepatic function panel: Unremarkable Lipids: Within normal limits Urinalysis: No UTI, no hematuria.  Will give Dilaudid for pain control and proceed with CT angio for further assessment. DDX: nephrolithiasis, dissection,   Imaging Studies ordered:  I ordered imaging studies which included CTA chest/abdomen/pelvis dissection study, I independently reviewed, formal radiology  impression shows: Negative CTA of the aorta.  No explanation for symptoms.   ED Course:  Patient with overall reassuring work-up today.  Labs are fairly unremarkable, no electrolyte, renal, or hepatic dysfunction.  Urinalysis without UTI or hematuria.  CT angio is completely unremarkable.  No findings of dissection, additionally no findings of nephrolithiasis or acute intra abdominal pathology.  Patient had no testicular tenderness on exam.  He has no neurologic deficits to suggest cord compression or cauda equina syndrome.  He denies IVDU and is afebrile therefore doubt epidural abscess.   On reassessment of the patient he is feeling much better, remains with minimal discomfort, continues to have no neurologic deficits and is ambulatory with symmetric pulses on reassessment.  Upon further discussion with the patient he does admit that he was out with friends earlier tonight just before the pain started and that he had taken some ecstasy tablets with his friends.  He states he does not usually use drugs.  Unclear if this was contributing to his symptoms.  Given his overall reassuring work-up in the emergency department with symptomatic improvement feel he is reasonable for discharge home at this time.  Advised against additional drug use.  We will provide naproxen and Robaxin for any continued discomfort with close PCP follow-up and strict return precautions. I discussed results, treatment plan, need for follow-up, and return precautions with the patient. Provided opportunity for questions, patient confirmed understanding and is in agreement with plan.   Findings and plan of care discussed with supervising physician Dr. Betsey Holiday who is in agreement.   Portions of this note were generated with [employer]. Dictation errors may occur despite best attempts at proofreading.  Final Clinical Impression(s) / ED Diagnoses Final diagnoses:  Acute bilateral low back pain, unspecified whether sciatica  present    Rx / DC Orders ED Discharge Orders          Ordered    naproxen (NAPROSYN) 500 MG tablet  2 times daily PRN        09/14/21 0653    methocarbamol (ROBAXIN) 500 MG tablet  Every 8 hours PRN        09/14/21 0653    ondansetron (ZOFRAN-ODT) 4 MG disintegrating tablet  Every 8 hours PRN        09/14/21 0653             Amaryllis Dyke, PA-C 09/14/21 0705    Orpah Greek, MD 09/14/21 [phone]

## 2021-09-14 NOTE — ED Triage Notes (Signed)
Pt BIB EMS with left flank pain x 2 hrs. Frequent and painful urination.  200 mcg Fentanyl  4 mg Zofran 18 g left ac

## 2021-09-14 NOTE — ED Notes (Signed)
Pt is vomiting and sweating.

## 2021-09-14 NOTE — Discharge Instructions (Addendum)
You were seen in the emergency department this evening for back pain.  Your blood work and CT scan were reassuring without significant abnormalities.  We are sending you home with the following medicines help with any continued symptoms you may have:  -Zofran: Take every 8 hours as needed for nausea and vomiting  - Naproxen- this is a nonsteroidal anti-inflammatory medication that will help with pain and swelling. Be sure to take this medication as prescribed with food, 1 pill every 12 hours,  It should be taken with food, as it can cause stomach upset, and more seriously, stomach bleeding. Do not take other nonsteroidal anti-inflammatory medications with this such as Advil, Motrin, Aleve, Mobic, Goodie Powder, or Motrin etc..    - Robaxin- this is the muscle relaxer I have prescribed, this is meant to help with muscle tightness/spasms. Be aware that this medication may make you drowsy therefore the first time you take this it should be at a time you are in an environment where you can rest. Do not drive or operate heavy machinery when taking this medication. Do not drink alcohol or take other sedating medications with this medicine such as narcotics or benzodiazepines.   You make take Tylenol per over the counter dosing with these medications.   We have prescribed you new medication(s) today. Discuss the medications prescribed today with your pharmacist as they can have adverse effects and interactions with your other medicines including over the counter and prescribed medications. Seek medical evaluation if you start to experience new or abnormal symptoms after taking one of these medicines, seek care immediately if you start to experience difficulty breathing, feeling of your throat closing, facial swelling, or rash as these could be indications of a more serious allergic reaction  Please rest, avoid any drug use, stay well hydrated.  Follow up with primary care as soon as possible, return to the ER  for new or worsening symptoms including but not limited to return of pain, new or worsening pain, numbness, weakness, loss of bowel/bladder control, inability to keep fluids down, fever, or any other concerns.

## 2022-09-25 ENCOUNTER — Other Ambulatory Visit: Payer: Self-pay

## 2022-09-25 ENCOUNTER — Emergency Department (HOSPITAL_COMMUNITY): Payer: 59

## 2022-09-25 ENCOUNTER — Emergency Department (HOSPITAL_COMMUNITY)
Admission: EM | Admit: 2022-09-25 | Discharge: 2022-09-25 | Disposition: A | Discharge status: Home / Self Care | Payer: 59 | Attending: Emergency Medicine | Admitting: Emergency Medicine

## 2022-09-25 DIAGNOSIS — S99912A Unspecified injury of left ankle, initial encounter: Secondary | ICD-10-CM | POA: Diagnosis present

## 2022-09-25 DIAGNOSIS — S82392A Other fracture of lower end of left tibia, initial encounter for closed fracture: Secondary | ICD-10-CM | POA: Insufficient documentation

## 2022-09-25 DIAGNOSIS — S00412A Abrasion of left ear, initial encounter: Secondary | ICD-10-CM | POA: Insufficient documentation

## 2022-09-25 DIAGNOSIS — S82892A Other fracture of left lower leg, initial encounter for closed fracture: Secondary | ICD-10-CM

## 2022-09-25 LAB — BASIC METABOLIC PANEL
Anion gap: 13 (ref 5–15)
BUN: 9 mg/dL (ref 6–20)
CO2: 22 mmol/L (ref 22–32)
Calcium: 9.2 mg/dL (ref 8.9–10.3)
Chloride: 102 mmol/L (ref 98–111)
Creatinine, Ser: 1.51 mg/dL — ABNORMAL HIGH (ref 0.61–1.24)
GFR, Estimated: >60 mL/min (ref >60)
Glucose, Bld: 106 mg/dL — ABNORMAL HIGH (ref 70–99)
Potassium: 3.2 mmol/L — ABNORMAL LOW (ref 3.5–5.1)
Sodium: 137 mmol/L (ref 135–145)

## 2022-09-25 LAB — CBC WITH DIFFERENTIAL/PLATELET
Abs Immature Granulocytes: 0.04 10*3/uL (ref 0.00–0.07)
Basophils Absolute: 0 10*3/uL (ref 0.0–0.1)
Basophils Relative: 1 %
Eosinophils Absolute: 0.3 10*3/uL (ref 0.0–0.5)
Eosinophils Relative: 3 %
HCT: 45.5 % (ref 39.0–52.0)
Hemoglobin: 15.5 g/dL (ref 13.0–17.0)
Immature Granulocytes: 1 %
Lymphocytes Relative: 25 %
Lymphs Abs: 2 10*3/uL (ref 0.7–4.0)
MCH: 29.8 pg (ref 26.0–34.0)
MCHC: 34.1 g/dL (ref 30.0–36.0)
MCV: 87.5 fL (ref 80.0–100.0)
Monocytes Absolute: 0.7 10*3/uL (ref 0.1–1.0)
Monocytes Relative: 8 %
Neutro Abs: 5.2 10*3/uL (ref 1.7–7.7)
Neutrophils Relative %: 62 %
Platelets: 245 10*3/uL (ref 150–400)
RBC: 5.2 MIL/uL (ref 4.22–5.81)
RDW: 13.5 % (ref 11.5–15.5)
WBC: 8.3 10*3/uL (ref 4.0–10.5)
nRBC: 0 % (ref 0.0–0.2)

## 2022-09-25 MED ORDER — FENTANYL CITRATE PF 50 MCG/ML IJ SOSY
PREFILLED_SYRINGE | INTRAMUSCULAR | Status: AC
  Administered 2022-09-25: 100 ug via INTRAVENOUS
  Filled 2022-09-25: qty 2

## 2022-09-25 MED ORDER — ETOMIDATE 2 MG/ML IV SOLN
10.0000 mg | Freq: Once | INTRAVENOUS | Status: DC
  Filled 2022-09-25: qty 10

## 2022-09-25 MED ORDER — HYDROMORPHONE HCL 1 MG/ML IJ SOLN
1.0000 mg | Freq: Once | INTRAMUSCULAR | Status: AC
  Administered 2022-09-25: 1 mg via INTRAVENOUS
  Filled 2022-09-25: qty 1

## 2022-09-25 MED ORDER — OXYCODONE-ACETAMINOPHEN 5-325 MG PO TABS: 2.0000 | ORAL_TABLET | ORAL | 0 refills | Status: DC | PRN

## 2022-09-25 MED ORDER — ETOMIDATE 2 MG/ML IV SOLN
INTRAVENOUS | Status: AC | PRN
  Administered 2022-09-25: 10 mg via INTRAVENOUS

## 2022-09-25 MED ORDER — OXYCODONE-ACETAMINOPHEN 5-325 MG PO TABS
2.0000 | ORAL_TABLET | Freq: Once | ORAL | Status: AC
  Administered 2022-09-25: 2 via ORAL
  Filled 2022-09-25: qty 2

## 2022-09-25 MED ORDER — LACTATED RINGERS IV BOLUS
1000.0000 mL | Freq: Once | INTRAVENOUS | Status: AC
  Administered 2022-09-25: 1000 mL via INTRAVENOUS

## 2022-09-25 MED ORDER — FENTANYL CITRATE PF 50 MCG/ML IJ SOSY: 100.0000 ug | PREFILLED_SYRINGE | Freq: Once | INTRAMUSCULAR | Status: AC

## 2022-09-25 NOTE — Progress Notes (Signed)
Orthopedic Tech Progress Note Patient Details:  Stephen Wells Mar 31, 1986 [id number]  Ortho Devices Type of Ortho Device: Post (short leg) splint, Stirrup splint Ortho Device/Splint Location: lle Ortho Device/Splint Interventions: Ordered, Application, Adjustment  I applied the splint post reduction with dr and rn assist. Dr molded the splint. Post Interventions Patient Tolerated: Well Instructions Provided: Care of device, Adjustment of device  [name] 09/25/2022, 4:25 AM
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
## 2022-09-25 NOTE — Progress Notes (Signed)
Orthopedic Tech Progress Note Patient Details:  Stephen Wells [birth year] [id number]  Ortho Devices Type of Ortho Device: Crutches Ortho Device/Splint Location: lle Ortho Device/Splint Interventions: Ordered, Application, Adjustment   Post Interventions Patient Tolerated: Well Instructions Provided: Care of device, Adjustment of device  [name] 09/25/2022, 5:28 AM

## 2023-07-11 ENCOUNTER — Other Ambulatory Visit: Payer: Self-pay | Admitting: Urology

## 2023-07-12 LAB — PSA: Prostate Specific Ag, Serum: 0.9 ng/mL (ref 0.0–4.0)

## 2023-07-13 ENCOUNTER — Telehealth: Payer: Self-pay

## 2023-07-13 NOTE — Telephone Encounter (Signed)
Attempted to contact patient regarding lab results,left message on voicemail requesting return call.

## 2023-07-19 ENCOUNTER — Telehealth: Payer: Self-pay

## 2023-07-19 NOTE — Telephone Encounter (Signed)
Patient informed PSA results borderline normal (0.9), needs to recheck in 1 year. Per Dr. Vevelyn Royals recommendations needs to be less than 1.0 for males under 36 years old. Patient informed to contact his pcp if he has frequent urination, pain, or blood in urine. Patient stated that he has increased his water intake, and has noticed that he is going to the restroom more frequently. Patient informed to continue to monitor, if he has increased frequent urination, needs to see his pcp, verbalized understanding.

## 2024-01-18 ENCOUNTER — Ambulatory Visit (HOSPITAL_BASED_OUTPATIENT_CLINIC_OR_DEPARTMENT_OTHER): Admitting: Family Medicine

## 2024-03-01 ENCOUNTER — Telehealth (HOSPITAL_BASED_OUTPATIENT_CLINIC_OR_DEPARTMENT_OTHER): Payer: Self-pay | Admitting: Family Medicine

## 2024-03-01 ENCOUNTER — Encounter (HOSPITAL_BASED_OUTPATIENT_CLINIC_OR_DEPARTMENT_OTHER): Payer: Self-pay | Admitting: Family Medicine

## 2024-03-01 ENCOUNTER — Ambulatory Visit (HOSPITAL_BASED_OUTPATIENT_CLINIC_OR_DEPARTMENT_OTHER): Admitting: Family Medicine

## 2024-03-01 VITALS — BP 128/92 | HR 84 | Ht 69.0 in | Wt 227.2 lb

## 2024-03-01 DIAGNOSIS — R0683 Snoring: Secondary | ICD-10-CM | POA: Diagnosis not present

## 2024-03-01 DIAGNOSIS — R8290 Unspecified abnormal findings in urine: Secondary | ICD-10-CM | POA: Diagnosis not present

## 2024-03-01 DIAGNOSIS — Z0000 Encounter for general adult medical examination without abnormal findings: Secondary | ICD-10-CM | POA: Diagnosis not present

## 2024-03-01 LAB — POCT URINALYSIS DIP (CLINITEK)
Bilirubin, UA: NEGATIVE
Blood, UA: NEGATIVE
Glucose, UA: NEGATIVE mg/dL
Ketones, POC UA: NEGATIVE mg/dL
Leukocytes, UA: NEGATIVE
Nitrite, UA: NEGATIVE
POC PROTEIN,UA: 30 — AB
Spec Grav, UA: >=1.03 — AB (ref 1.010–1.025)
Urobilinogen, UA: 0.2 U/dL
pH, UA: 5.5 (ref 5.0–8.0)

## 2024-03-01 NOTE — Assessment & Plan Note (Signed)
 Urine testing in office today is generally reassuring.  Evidence of dehydration, however no blood, leukocyte esterase or nitrites noted. We can proceed with blood test at this time for further evaluation.  We will monitor symptoms moving forward as well
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
## 2024-03-01 NOTE — Telephone Encounter (Signed)
 noted

## 2024-03-01 NOTE — Assessment & Plan Note (Signed)
 Concern for potential underlying sleep apnea.  We will refer patient to sleep medicine specialist for further evaluation

## 2024-03-01 NOTE — Telephone Encounter (Signed)
 Patient is scheduled for his next appt on 8/28 due to availability but on his check out notes it said return in 1 month for physical.  Just wanted to let you know in case we need to fit him in earlier.

## 2024-03-01 NOTE — Patient Instructions (Signed)
  Medication Instructions:  Your physician recommends that you continue on your current medications as directed. Please refer to the Current Medication list given to you today. --If you need a refill on any your medications before your next appointment, please call your pharmacy first. If no refills are authorized on file call the office.-- Lab Work: Your physician has recommended that you have lab work today: fasting labs next week If you have labs (blood work) drawn today and your tests are completely normal, you will receive your results via MyChart message OR a phone call from our staff.  Please ensure you check your voicemail in the event that you authorized detailed messages to be left on a delegated number. If you have any lab test that is abnormal or we need to change your treatment, we will call you to review the results.  Referrals/Procedures/Imaging: Sleep medicine  Follow-Up: Your next appointment:   Your physician recommends that you schedule a follow-up appointment in: 1 month physical  with Dr. de Peru  You will receive a text message or e-mail with a link to a survey about your care and experience with us  today! We would greatly appreciate your feedback!   Thanks for letting us  be apart of your health journey!!  Primary Care and Sports Medicine   Dr. Court Distance Peru   We encourage you to activate your patient portal called "MyChart".  Sign up information is provided on this After Visit Summary.  MyChart is used to connect with patients for Virtual Visits (Telemedicine).  Patients are able to view lab/test results, encounter notes, upcoming appointments, etc.  Non-urgent messages can be sent to your provider as well. To learn more about what you can do with MyChart, please visit --  [email or web address].

## 2024-03-07 ENCOUNTER — Other Ambulatory Visit (HOSPITAL_BASED_OUTPATIENT_CLINIC_OR_DEPARTMENT_OTHER): Payer: Self-pay | Admitting: *Deleted

## 2024-03-07 ENCOUNTER — Other Ambulatory Visit (HOSPITAL_COMMUNITY): Payer: Self-pay | Admitting: Family Medicine

## 2024-03-07 DIAGNOSIS — Z0000 Encounter for general adult medical examination without abnormal findings: Secondary | ICD-10-CM

## 2024-03-08 LAB — CBC WITH DIFFERENTIAL/PLATELET
Basophils Absolute: 0.1 10*3/uL (ref 0.0–0.2)
Basos: 1 %
EOS (ABSOLUTE): 0.4 10*3/uL (ref 0.0–0.4)
Eos: 6 %
Hematocrit: 46.6 % (ref 37.5–51.0)
Hemoglobin: 15.4 g/dL (ref 13.0–17.7)
Immature Grans (Abs): 0.1 10*3/uL (ref 0.0–0.1)
Immature Granulocytes: 1 %
Lymphocytes Absolute: 3.1 10*3/uL (ref 0.7–3.1)
Lymphs: 41 %
MCH: 29.8 pg (ref 26.6–33.0)
MCHC: 33 g/dL (ref 31.5–35.7)
MCV: 90 fL (ref 79–97)
Monocytes Absolute: 0.7 10*3/uL (ref 0.1–0.9)
Monocytes: 9 %
Neutrophils Absolute: 3.3 10*3/uL (ref 1.4–7.0)
Neutrophils: 42 %
Platelets: 358 10*3/uL (ref 150–450)
RBC: 5.16 x10E6/uL (ref 4.14–5.80)
RDW: 13.6 % (ref 11.6–15.4)
WBC: 7.6 10*3/uL (ref 3.4–10.8)

## 2024-03-08 LAB — COMPREHENSIVE METABOLIC PANEL WITH GFR
ALT: 31 IU/L (ref 0–44)
AST: 21 IU/L (ref 0–40)
Albumin: 4.2 g/dL (ref 4.1–5.1)
Alkaline Phosphatase: 80 IU/L (ref 44–121)
BUN/Creatinine Ratio: 13 (ref 9–20)
BUN: 15 mg/dL (ref 6–20)
Bilirubin Total: <0.2 mg/dL (ref 0.0–1.2)
CO2: 23 mmol/L (ref 20–29)
Calcium: 9.2 mg/dL (ref 8.7–10.2)
Chloride: 104 mmol/L (ref 96–106)
Creatinine, Ser: 1.17 mg/dL (ref 0.76–1.27)
Globulin, Total: 2.2 g/dL (ref 1.5–4.5)
Glucose: 103 mg/dL — ABNORMAL HIGH (ref 70–99)
Potassium: 4.5 mmol/L (ref 3.5–5.2)
Sodium: 141 mmol/L (ref 134–144)
Total Protein: 6.4 g/dL (ref 6.0–8.5)
eGFR: 82 mL/min/{1.73_m2} (ref >59)

## 2024-03-08 LAB — LIPID PANEL
Chol/HDL Ratio: 3.6 ratio (ref 0.0–5.0)
Cholesterol, Total: 174 mg/dL (ref 100–199)
HDL: 48 mg/dL (ref >39)
LDL Chol Calc (NIH): 101 mg/dL — ABNORMAL HIGH (ref 0–99)
Triglycerides: 143 mg/dL (ref 0–149)
VLDL Cholesterol Cal: 25 mg/dL (ref 5–40)

## 2024-03-08 LAB — HEMOGLOBIN A1C
Est. average glucose Bld gHb Est-mCnc: 114 mg/dL
Hgb A1c MFr Bld: 5.6 % (ref 4.8–5.6)

## 2024-03-08 LAB — TSH RFX ON ABNORMAL TO FREE T4: TSH: 0.911 u[IU]/mL (ref 0.450–4.500)

## 2024-03-12 ENCOUNTER — Ambulatory Visit (HOSPITAL_BASED_OUTPATIENT_CLINIC_OR_DEPARTMENT_OTHER): Payer: Self-pay | Admitting: Family Medicine

## 2024-04-10 ENCOUNTER — Encounter: Payer: Self-pay | Admitting: Neurology

## 2024-04-10 ENCOUNTER — Ambulatory Visit (INDEPENDENT_AMBULATORY_CARE_PROVIDER_SITE_OTHER): Admitting: Neurology

## 2024-04-10 VITALS — BP 133/82 | HR 110 | Ht 69.0 in | Wt 228.2 lb

## 2024-04-10 DIAGNOSIS — R0681 Apnea, not elsewhere classified: Secondary | ICD-10-CM

## 2024-04-10 DIAGNOSIS — R351 Nocturia: Secondary | ICD-10-CM

## 2024-04-10 DIAGNOSIS — E66811 Obesity, class 1: Secondary | ICD-10-CM

## 2024-04-10 DIAGNOSIS — R0683 Snoring: Secondary | ICD-10-CM

## 2024-04-10 DIAGNOSIS — Z9189 Other specified personal risk factors, not elsewhere classified: Secondary | ICD-10-CM

## 2024-04-10 DIAGNOSIS — G4719 Other hypersomnia: Secondary | ICD-10-CM

## 2024-04-10 DIAGNOSIS — Z820 Family history of epilepsy and other diseases of the nervous system: Secondary | ICD-10-CM

## 2024-04-10 NOTE — Progress Notes (Signed)
 Subjective:    Patient ID: Stephen Wells is a 38 y.o. male.  HPI    True Mar, MD, PhD [facility] [address] [address] [place], [place] [address]  Dear Dr. de Peru,  I saw your patient, Stephen Wells, upon your kind request in my sleep clinic today for initial consultation of his sleep disorder, in particular, concern for underlying obstructive sleep apnea.  The patient is unaccompanied today.  As you know, Stephen Wells is a 38 year old male with an underlying medical history of low back pain, left ankle fracture, prior smoking with recent cessation but ongoing nicotine use, and obesity, who reports snoring and excessive daytime somnolence as well as witnessed apneas per significant other (his fiance, Stephen Wells).  His Epworth sleepiness score is 6 out of 24, fatigue severity score is 19 out of 63.  I reviewed your office note from 03/01/2024.  Both his parents have sleep apnea and use CPAP machines.  He goes to bed around 10 and rise time is around 5:30 AM.  He has nocturia about once or twice per average night, denies recurrent nocturnal or morning headaches.  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  He lives with his fiance and 63-year-old daughter.  She has her own room.  They have a small dog in the household and the dog does not sleep in the bedroom with them.  He sleeps with the TV on at night but Stephen Wells typically turns it off.  He has fairly stable weight.  He quit smoking cigarettes about a month ago but does vape nicotine, not daily.  He quit THC smoking several years ago.  He drinks no daily caffeine, he drinks alcohol nearly daily, 1 or 2 drinks.  He is in the process of getting braces.  His Past Medical History Is Significant For: No past medical history on file.  His Past Surgical History Is Significant For: Past Surgical History:  Procedure Laterality Date   ANKLE SURGERY Left    2023    His Family History Is  Significant For: Family History  Problem Relation Age of Onset   High Cholesterol Mother    High Cholesterol Father    Multiple sclerosis Sister     His Social History Is Significant For: Social History   Socioeconomic History   Marital status: Single    Spouse name: Not on file   Number of children: Not on file   Years of education: Not on file   Highest education level: Not on file  Occupational History   Not on file  Tobacco Use   Smoking status: Former    Types: Cigarettes    Passive exposure: Current   Smokeless tobacco: Never   Tobacco comments:    Switching to vaping  Vaping Use   Vaping status: Some Days  Substance and Sexual Activity   Alcohol use: Yes    Comment: nightly   Drug use: Not Currently    Types: Marijuana   Sexual activity: Not on file  Other Topics Concern   Not on file  Social History Narrative   Caffiene soda occasionally   Working [place] works with heavy Sales promotion account executive, PT truck driver   Lives with fiance, 3 yr daughter   Social Drivers of Corporate investment banker Strain: Not on file  Food Insecurity: Not on file  Transportation Needs: Not on file  Physical Activity: Not on file  Stress: Not on file  Social  Connections: Not on file    His Allergies Are:  No Known Allergies:   His Current Medications Are:  No outpatient encounter medications on file as of 04/10/2024.   No facility-administered encounter medications on file as of 04/10/2024.  :   Review of Systems:  Out of a complete 14 point review of systems, all are reviewed and negative with the exception of these symptoms as listed below:  Review of Systems  Neurological:        Snoring, witnessed apnea by fiance.  Fragmented sleep.  ESS 6. FSS 19.  No sleep study.    Objective:  Neurological Exam  Physical Exam Physical Examination:   Vitals:   04/10/24 1117  BP: 133/82  Pulse: (!) 110    General Examination: The patient is a very pleasant 37 y.o. male in no  acute distress. He appears well-developed and well-nourished and well groomed.   HEENT: Normocephalic, atraumatic, pupils are equal, round and reactive to light, extraocular tracking is good without limitation to gaze excursion or nystagmus noted. Hearing is grossly intact. Face is symmetric with normal facial animation. Speech is clear with no dysarthria noted. There is no hypophonia. There is no lip, neck/head, jaw or voice tremor. Neck is supple with full range of passive and active motion. There are no carotid bruits on auscultation. Oropharynx exam reveals: mild mouth dryness, good dental hygiene and moderate airway crowding, due to small airway entry and redundant soft palate, along the uvula, slightly wider tongue.  Tonsils 1+ bilaterally, right side easier to see compared to left, neck circumference 16-3/8 inches, slight underbite noted.  Tongue protrudes centrally and palate elevates symmetrically.  Chest: Clear to auscultation without wheezing, rhonchi or crackles noted.  Heart: S1+S2+0, regular and normal without murmurs, rubs or gallops noted.   Abdomen: Soft, non-tender and non-distended.  Extremities: There is no pitting edema in the distal lower extremities bilaterally.   Skin: Warm and dry without trophic changes noted.   Musculoskeletal: exam reveals no obvious joint deformities.   Neurologically:  Mental status: The patient is awake, alert and oriented in all 4 spheres. His immediate and remote memory, attention, language skills and fund of knowledge are appropriate. There is no evidence of aphasia, agnosia, apraxia or anomia. Speech is clear with normal prosody and enunciation. Thought process is linear. Mood is normal and affect is normal.  Cranial nerves II - XII are as described above under HEENT exam.  Motor exam: Normal bulk, strength and tone is noted. There is no obvious action or resting tremor.  Fine motor skills and coordination: grossly intact.  Cerebellar testing:  No dysmetria or intention tremor. There is no truncal or gait ataxia.  Sensory exam: intact to light touch in the upper and lower extremities.  Gait, station and balance: He stands easily. No veering to one side is noted. No leaning to one side is noted. Posture is age-appropriate and stance is narrow based. Gait shows normal stride length and normal pace. No problems turning are noted.   Assessment and Plan:  In summary, Stephen Wells is a very pleasant 70 y.o.-year old male with an underlying medical history of low back pain, left ankle fracture, prior smoking, and obesity, whose history and physical exam are concerning for sleep disordered breathing, particularly obstructive sleep apnea (OSA).  While a laboratory attended sleep study is typically considered gold standard for evaluation of sleep disordered breathing, we mutually agreed to proceed with a home sleep test at this time.  I had a long chat with the patient about my findings and the diagnosis of sleep apnea, particularly OSA, its prognosis and treatment options. We talked about medical/conservative treatments, surgical interventions and non-pharmacological approaches for symptom control. I explained, in particular, the risks and ramifications of untreated moderate to severe OSA, especially with respect to developing cardiovascular disease down the road, including congestive heart failure (CHF), difficult to treat hypertension, cardiac arrhythmias (particularly A-fib), neurovascular complications including TIA, stroke and dementia. Even type 2 diabetes has, in part, been linked to untreated OSA. Symptoms of untreated OSA may include (but may not be limited to) daytime sleepiness, nocturia (i.e. frequent nighttime urination), memory problems, mood irritability and suboptimally controlled or worsening mood disorder such as depression and/or anxiety, lack of energy, lack of motivation, physical discomfort, as well as recurrent headaches,  especially morning or nocturnal headaches. We talked about the importance of maintaining a healthy lifestyle and striving for healthy weight.  The importance of complete nicotine cessation was also addressed.  In addition, we talked about the importance of striving for and maintaining good sleep hygiene. I recommended a sleep study at this time. I outlined the differences between a laboratory attended sleep study which is considered more comprehensive and accurate over the option of a home sleep test (HST); the latter may lead to underestimation of sleep disordered breathing in some instances and does not help with diagnosing upper airway resistance syndrome and is not accurate enough to diagnose primary central sleep apnea typically. I outlined possible surgical and non-surgical treatment options of OSA, including the use of a positive airway pressure (PAP) device (i.e. CPAP, AutoPAP/APAP or BiPAP in certain circumstances), a custom-made dental device (aka oral appliance, which would require a referral to a specialist dentist or orthodontist typically, and is generally speaking not considered for patients with full dentures or edentulous state), upper airway surgical options, such as traditional UPPP (which is not considered a first-line treatment) or the Inspire device (hypoglossal nerve stimulator, which would involve a referral for consultation with an ENT surgeon, after careful selection, following inclusion criteria - also not first-line treatment). I explained the PAP treatment option to the patient in detail, as this is generally considered first-line treatment.  The patient indicated that he would be willing to try PAP therapy, if the need arises. I explained the importance of being compliant with PAP treatment, not only for insurance purposes but primarily to improve patient's symptoms symptoms, and for the patient's long term health benefit, including to reduce His cardiovascular risks longer-term.     We will pick up our discussion about the next steps and treatment options after testing.  We will keep him posted as to the test results by phone call and/or MyChart messaging where possible.  We will plan to follow-up in sleep clinic accordingly as well.  I answered all his questions today and the patient was in agreement.   I encouraged him to call with any interim questions, concerns, problems or updates or email us  through MyChart.  Generally speaking, sleep test authorizations may take up to 2 weeks, sometimes less, sometimes longer, the patient is encouraged to get in touch with us  if they do not hear back from the sleep lab staff directly within the next 2 weeks.  Thank you very much for allowing me to participate in the care of this nice patient. If I can be of any further assistance to you please do not hesitate to call me at [phone].  Sincerely,  True Mar, MD, PhD

## 2024-04-10 NOTE — Patient Instructions (Signed)

## 2024-04-19 ENCOUNTER — Ambulatory Visit (INDEPENDENT_AMBULATORY_CARE_PROVIDER_SITE_OTHER): Admitting: Neurology

## 2024-04-19 DIAGNOSIS — R351 Nocturia: Secondary | ICD-10-CM

## 2024-04-19 DIAGNOSIS — G4719 Other hypersomnia: Secondary | ICD-10-CM

## 2024-04-19 DIAGNOSIS — Z9189 Other specified personal risk factors, not elsewhere classified: Secondary | ICD-10-CM

## 2024-04-19 DIAGNOSIS — G4733 Obstructive sleep apnea (adult) (pediatric): Secondary | ICD-10-CM

## 2024-04-19 DIAGNOSIS — Z820 Family history of epilepsy and other diseases of the nervous system: Secondary | ICD-10-CM

## 2024-04-19 DIAGNOSIS — R0683 Snoring: Secondary | ICD-10-CM

## 2024-04-19 DIAGNOSIS — R0681 Apnea, not elsewhere classified: Secondary | ICD-10-CM

## 2024-04-19 DIAGNOSIS — E66811 Obesity, class 1: Secondary | ICD-10-CM

## 2024-04-25 NOTE — Progress Notes (Unsigned)
   [facility]  HOME SLEEP TEST [facility]) REPORT (Mail-Out Device):   STUDY DATE: ***  DOB: 1986/08/12  MRN: [id number]  ORDERING CLINICIAN: True Mar, MD, PhD   REFERRING CLINICIAN: de [place], Quintin PARAS, MD   CLINICAL INFORMATION/HISTORY: 38 year old male with an underlying medical history of low back pain, left ankle fracture, prior smoking with recent cessation but ongoing nicotine use, and obesity, who reports snoring and excessive daytime somnolence as well as witnessed apneas.   PATIENT'S LAST REPORTED EPWORTH SLEEPINESS SCORE (ESS): 6/24.  BMI (at the time of sleep clinic visit and/or test date): 33.7 kg/m  FINDINGS:   Study Protocol:    The SANSA single-point-of-skin-contact chest-worn sensor - an FDA cleared and DOT approved type 4 home sleep test device - measures eight physiological channels,  including blood oxygen saturation (measured via PPG [photoplethysmography]), EKG-derived heart rate, respiratory effort, chest movement (measured via accelerometer), snoring, body position, and actigraphy. The device is designed to be worn for up to 10 hours per study.   Sleep Summary:   Total Recording Time (hours, min): *** hours, *** min  Total Effective Sleep Time (hours, min):  *** hours, *** min  Sleep Efficiency (%):    ***%   Respiratory Indices:   Calculated sAHI (per hour):  ***/hour         Oxygen Saturation Statistics:    Oxygen Saturation (%) Mean: ***%   Minimum oxygen saturation (%):                 ***%   O2 Saturation Range (%): *** - ***%   Time below or at 88% saturation: *** min   Pulse Rate Statistics:   Pulse Mean (bpm):    ***/min    Pulse Range (*** - ***/min)   Snoring: ***   IMPRESSION/DIAGNOSES:   OSA (obstructive sleep apnea), ***  ***Nocturnal Hypoxemia  RECOMMENDATIONS:   ***   INTERPRETING PHYSICIAN:   True Mar, MD, PhD Medical Director, [facility]  [facility]) Diplomat, ABPN (Neurology and Sleep)   [facility] [address] [place], [place] [address] [phone]

## 2024-04-26 ENCOUNTER — Ambulatory Visit: Payer: Self-pay | Admitting: Neurology

## 2024-04-26 DIAGNOSIS — G4733 Obstructive sleep apnea (adult) (pediatric): Secondary | ICD-10-CM

## 2024-04-26 NOTE — Procedures (Signed)
 [facility]  HOME SLEEP TEST (SANSA) REPORT (Mail-Out Device):   STUDY DATE: 04/22/2024  DOB: Sep 07, 1986  MRN: [id number]  ORDERING CLINICIAN: True Mar, MD, PhD   REFERRING CLINICIAN: de [place], Quintin PARAS, MD   CLINICAL INFORMATION/HISTORY: 38 year old male with an underlying medical history of low back pain, left ankle fracture, prior smoking with recent cessation but ongoing nicotine use, and obesity, who reports snoring and excessive daytime somnolence as well as witnessed apneas.   PATIENT'S LAST REPORTED EPWORTH SLEEPINESS SCORE (ESS): 6/24.  BMI (at the time of sleep clinic visit and/or test date): 33.7 kg/m  FINDINGS:   Study Protocol:    The SANSA single-point-of-skin-contact chest-worn sensor - an FDA cleared and DOT approved type 4 home sleep test device - measures eight physiological channels,  including blood oxygen saturation (measured via PPG [photoplethysmography]), EKG-derived heart rate, respiratory effort, chest movement (measured via accelerometer), snoring, body position, and actigraphy. The device is designed to be worn for up to 10 hours per study.   Sleep Summary:   Total Recording Time (hours, min): 8 hours, 18 min  Total Effective Sleep Time (hours, min):  2 hours, 29 min  Sleep Efficiency (%):    34%   Respiratory Indices:   Calculated sAHI (per hour):  61.8/hour         Oxygen Saturation Statistics:    Oxygen Saturation (%) Mean: 94.4%   Minimum oxygen saturation (%):                 76.3%   O2 Saturation Range (%): 76.3-99.6%   Time below or at 88% saturation: 6 min   Pulse Rate Statistics:   Pulse Mean (bpm):    61/min    Pulse Range (34-163/min)   Snoring: Moderate to loud  IMPRESSION/DIAGNOSES:   OSA (obstructive sleep apnea), severe   RECOMMENDATIONS:   This home sleep test demonstrates severe obstructive sleep apnea with a total AHI of 61.8/hour and O2 nadir of 76.3% with time below or at 88%  saturation of over 5 minutes for the study.  Study was limited due to limited total sleep time of less than 3 hours, sleep efficiency of 34% only. Urgent treatment with positive airway pressure is highly recommended. The patient will be advised to proceed with an autoPAP titration/trial at home. A laboratory attended titration study can be considered in the future for optimization of treatment settings and to improve tolerance and compliance, if needed, down the road. Alternative treatment options are limited secondary to the severity of the patient's sleep disordered breathing, but may include surgical treatment with an implantable hypoglossal nerve stimulator (in carefully selected candidates, meeting criteria).  Concomitant weight loss is recommended (where clinically appropriate). Please note, that untreated obstructive sleep apnea may carry additional perioperative morbidity. Patients with significant obstructive sleep apnea should receive perioperative PAP therapy and the surgeons and particularly the anesthesiologist should be informed of the diagnosis and the severity of the sleep disordered breathing. The patient should be cautioned not to drive, work at heights, or operate dangerous or heavy equipment when tired or sleepy. Review and reiteration of good sleep hygiene measures should be pursued with any patient. Other causes of the patient's symptoms, including circadian rhythm disturbances, an underlying mood disorder, medication effect and/or an underlying medical problem cannot be ruled out based on this test. Clinical correlation is recommended.  The patient and his referring provider will be notified of the test results. The patient will be seen in follow up in  sleep clinic at [facility].  I certify that I have reviewed the raw data recording prior to the issuance of this report in accordance with the standards of the American Academy of Sleep Medicine (AASM).    INTERPRETING PHYSICIAN:   True Mar, MD, PhD Medical Director, [facility]) Diplomat, ABPN (Neurology and Sleep)   [facility] [address], [address] [place], [place] [address] [phone]

## 2024-04-29 NOTE — Telephone Encounter (Signed)
 I called pt. I advised pt that Dr. Buck reviewed their sleep study results and found that pt has severe osa. Dr. Athar recommends that pt start an auto-PAP. I reviewed PAP compliance expectations with the pt. Pt is agreeable to starting an auto-PAP. I advised pt that an order will be sent to a DME, [facility], and [facility] will call the pt within about one week after they file with the pt's insurance. [facility] will show the pt how to use the machine, fit for masks, and troubleshoot the auto-PAP if needed. A follow up appt was made for insurance purposes with Duwaine, NP on 07/31/24 at 11:30am. . Pt verbalized understanding of results. Pt had no questions at this time but was encouraged to call back if questions arise. I have sent the order to [facility] and have received confirmation that they have received the order.

## 2024-05-30 ENCOUNTER — Encounter (HOSPITAL_BASED_OUTPATIENT_CLINIC_OR_DEPARTMENT_OTHER): Admitting: Family Medicine

## 2024-07-29 ENCOUNTER — Encounter: Admitting: Adult Health

## 2024-07-30 ENCOUNTER — Encounter: Payer: Self-pay | Admitting: *Deleted

## 2024-07-31 ENCOUNTER — Encounter: Payer: Self-pay | Admitting: Adult Health

## 2024-10-08 ENCOUNTER — Encounter (HOSPITAL_BASED_OUTPATIENT_CLINIC_OR_DEPARTMENT_OTHER): Admitting: Family Medicine
